# Patient Record
Sex: Male | Born: 1965 | Race: White | Hispanic: No | Marital: Married | Smoking: Never smoker
Health system: Southern US, Community
[De-identification: ages and names within clinical notes are randomized; demographics above are authoritative.]

## PROBLEM LIST (undated history)

## (undated) DIAGNOSIS — H209 Unspecified iridocyclitis: Secondary | ICD-10-CM

## (undated) DIAGNOSIS — R112 Nausea with vomiting, unspecified: Secondary | ICD-10-CM

## (undated) DIAGNOSIS — T8859XA Other complications of anesthesia, initial encounter: Secondary | ICD-10-CM

## (undated) DIAGNOSIS — T4145XA Adverse effect of unspecified anesthetic, initial encounter: Secondary | ICD-10-CM

## (undated) DIAGNOSIS — T7840XA Allergy, unspecified, initial encounter: Secondary | ICD-10-CM

## (undated) DIAGNOSIS — Z9889 Other specified postprocedural states: Secondary | ICD-10-CM

## (undated) HISTORY — DX: Allergy, unspecified, initial encounter: T78.40XA

## (undated) HISTORY — DX: Unspecified iridocyclitis: H20.9

## (undated) HISTORY — PX: MOUTH SURGERY: SHX715

## (undated) HISTORY — PX: ARTHROSCOPIC REPAIR ACL: SUR80

## (undated) HISTORY — PX: TONSILLECTOMY: SHX5217

## (undated) HISTORY — PX: KNEE ARTHROSCOPY W/ MENISCAL REPAIR: SHX1877

---

## 2002-10-25 ENCOUNTER — Ambulatory Visit (HOSPITAL_BASED_OUTPATIENT_CLINIC_OR_DEPARTMENT_OTHER): Admission: RE | Admit: 2002-10-25 | Discharge: 2002-10-25 | Payer: Self-pay | Admitting: Orthopaedic Surgery

## 2008-03-21 ENCOUNTER — Ambulatory Visit (HOSPITAL_BASED_OUTPATIENT_CLINIC_OR_DEPARTMENT_OTHER): Admission: RE | Admit: 2008-03-21 | Discharge: 2008-03-21 | Payer: Self-pay | Admitting: Orthopaedic Surgery

## 2008-06-12 ENCOUNTER — Encounter: Admission: RE | Admit: 2008-06-12 | Discharge: 2008-06-12 | Payer: Self-pay | Admitting: Family Medicine

## 2010-02-26 ENCOUNTER — Ambulatory Visit (HOSPITAL_BASED_OUTPATIENT_CLINIC_OR_DEPARTMENT_OTHER): Admission: RE | Admit: 2010-02-26 | Discharge: 2010-02-26 | Payer: Self-pay | Admitting: Orthopaedic Surgery

## 2010-11-05 NOTE — Op Note (Signed)
NAME:  Shawn Gillespie, Shawn Gillespie               ACCOUNT NO.:  [id number]   MEDICAL RECORD NO.:  [id number]          PATIENT TYPE:  AMB   LOCATION:  DSC                          FACILITY:  [facility]   PHYSICIAN:  Lubertha Basque. Dalldorf, M.D.DATE OF BIRTH:  Sep 22, 1965   DATE OF PROCEDURE:  03/21/2008  DATE OF DISCHARGE:                               OPERATIVE REPORT   PREOPERATIVE DIAGNOSIS:  1. Left knee torn medial meniscus.  2. Left knee chondromalacia.   POSTOPERATIVE DIAGNOSIS:  1. Left knee torn medial meniscus.  2. Left knee chondromalacia.   PROCEDURES:  1. Left knee partial medial meniscectomy.  2. Left knee abrasion chondroplasty patellofemoral.   ANESTHESIA:  General.   ATTENDING SURGEON:  Lubertha Basque. Jerl Santos, MD.   ASSISTANT:  Lindwood Qua, PA   INDICATIONS FOR PROCEDURE:  The patient is a 45 year old man who is  several years from an ACL reconstruction.  Unfortunately, suffered a  painful episode with his knee few months ago, which has not gotten  better.  It is persisted with pain and a locking sensation.  By MRI  scan, he has a displaced bucket-handle tear of his meniscus.  He is  offered an arthroscopy.  Informed operative consent was obtained  discussion of possible complications of reaction to anesthesia and  infection.   SUMMARY/FINDINGS/PROCEDURE:  Under general anesthesia, an arthroscopy of  the left knee was performed.  Suprapatellar pouch was benign while the  patellofemoral joint exhibited some grade-4 change mostly to the  intertrochlear groove.  A thorough chondroplasty was done along with  bleeding bone abrasion in some small areas.  Medial compartment  exhibited a displaced bucket-handle tear addressed with 25% partial  medial meniscectomy.  He also had some grade-4 change on the end of the  femur in a quarter-sized area and a thorough chondroplasty was done here  of loose articular cartilage.  The ACL reconstruction graft appeared to  be intact.  The lateral  compartment exhibited no evidence of meniscal or  articular cartilage injury.   DESCRIPTION OF PROCEDURE:  The patient was taken to the operating suite  where general anesthetic was applied without difficulty.  He is  positioned supine and prepped and draped in normal sterile fashion.  After the administration of IV Kefzol, an arthroscopy of the left knee  was performed through total of two portals.  Findings were as noted  above.  Procedure consisted of the partial medial meniscectomy done with  shaver and basket followed by the abrasion chondroplasty patellofemoral  and chondroplasty medial.  The knee was thoroughly irrigated followed by  placement of Marcaine with epinephrine and morphine.  Adaptic was placed  over the portals followed by dry gauze and a loose Ace wrap.  Estimated  blood loss and fluids obtained from anesthesia records.   DISPOSITION:  The patient was explained in the operating room and taken  to the recovery room in stable condition.  He was to go home same day  and follow up in the office closely.  I will contact him by phone  tonight.      Lubertha Basque Jerl Santos, M.D.  Electronically Signed     PGD/MEDQ  D:  03/21/2008  T:  03/22/2008  Job:  [id number]

## 2010-11-08 IMAGING — US US CAROTID DUPLEX BILAT
1 series · 13 of 24 positions shown · non-contrast
Comparison: None

CLINICAL DATA: Left neck bruit.

BILATERAL CAROTID DUPLEX ULTRASOUND
TECHNIQUE: Gray scale imaging, color Doppler and duplex ultrasound
was performed of bilateral carotid and vertebral arteries in the
neck.

[Series 1: us carotid duplex bilat · 0.09mm/px · 13 of 55 slices shown]
[im 1/55]
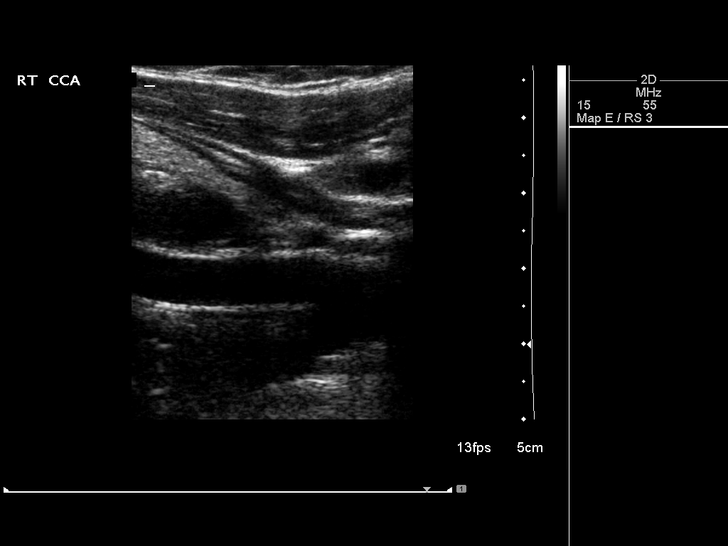
[im 5/55]
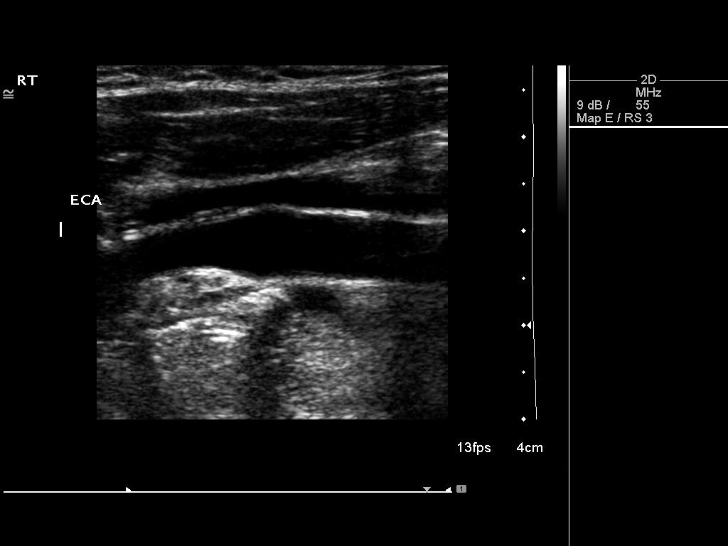
[im 10/55]
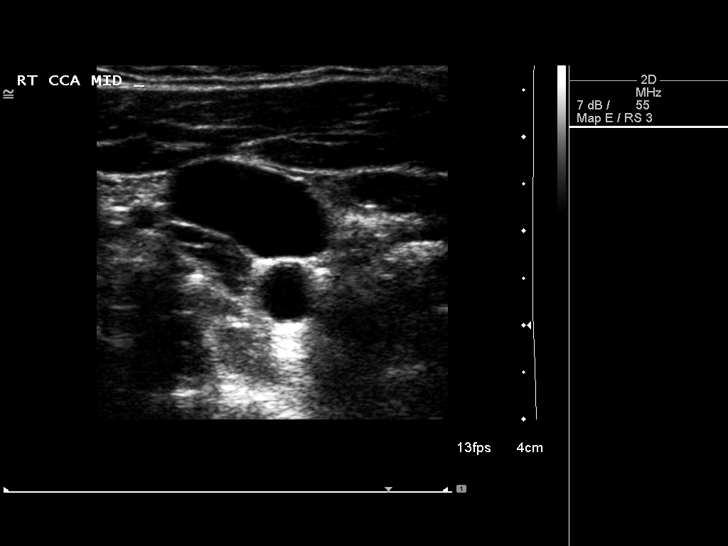
[im 15/55]
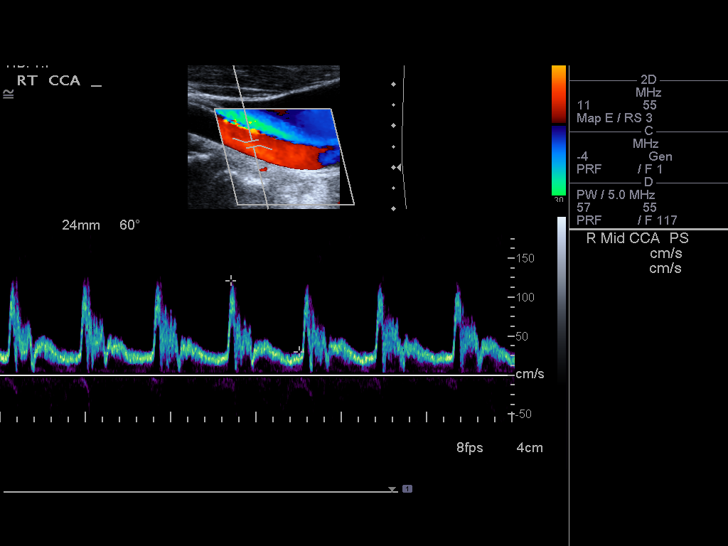
[im 19/55]
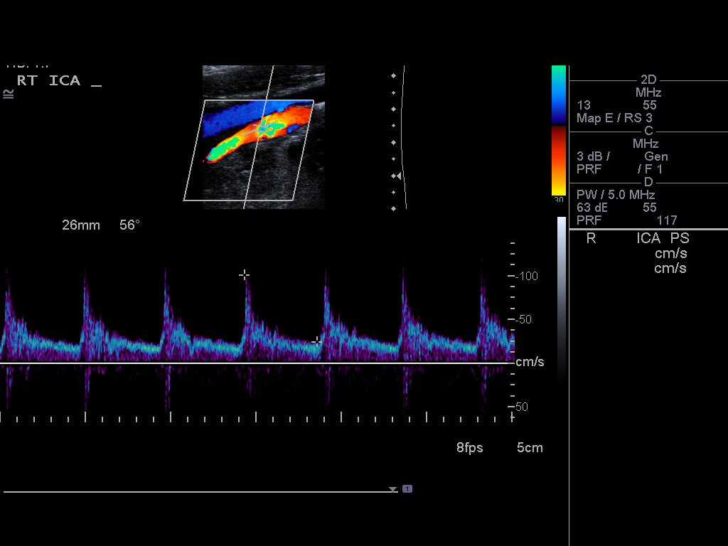
[im 24/55]
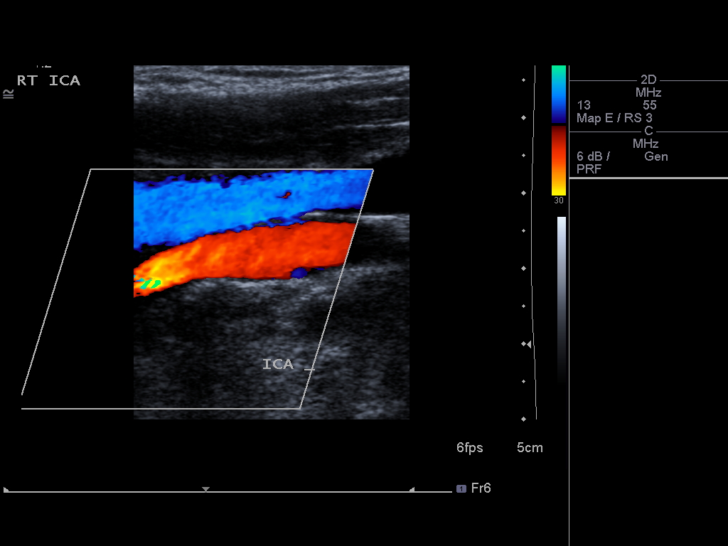
[im 29/55]
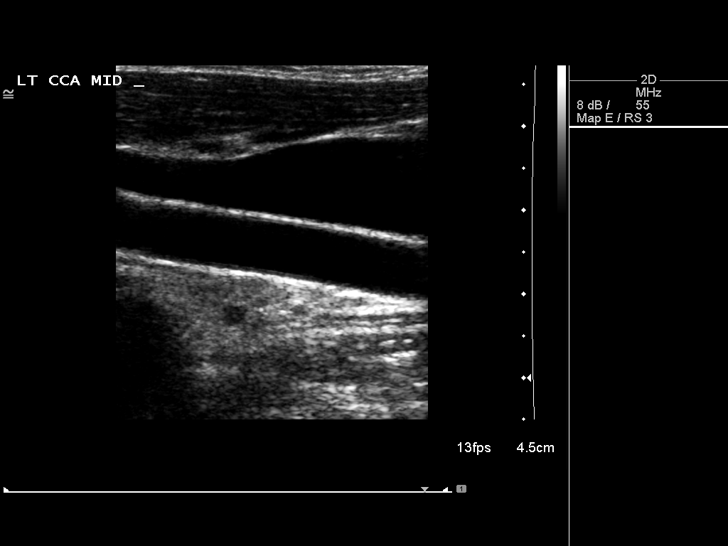
[im 31/55]
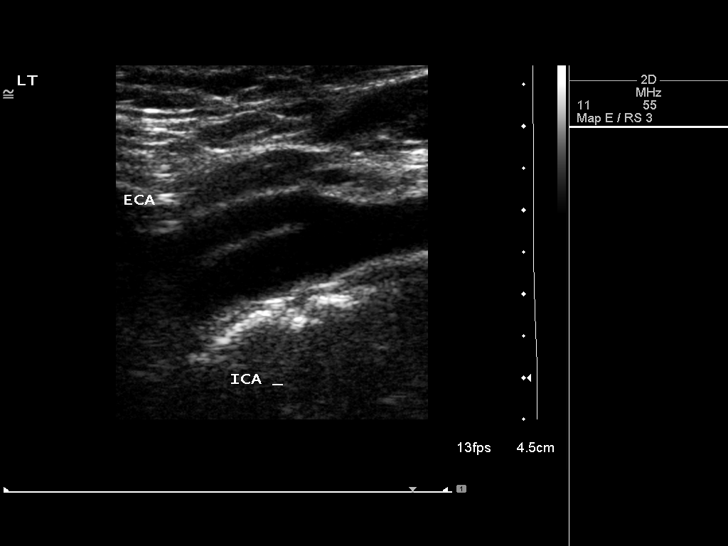
[im 36/55]
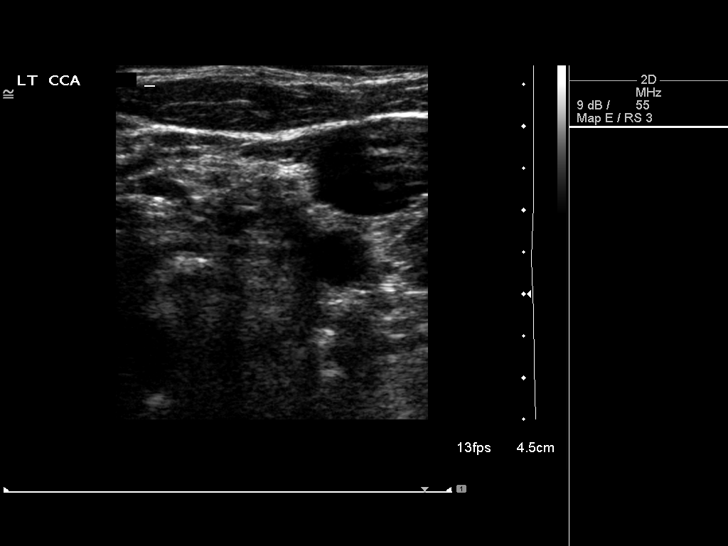
[im 40/55]
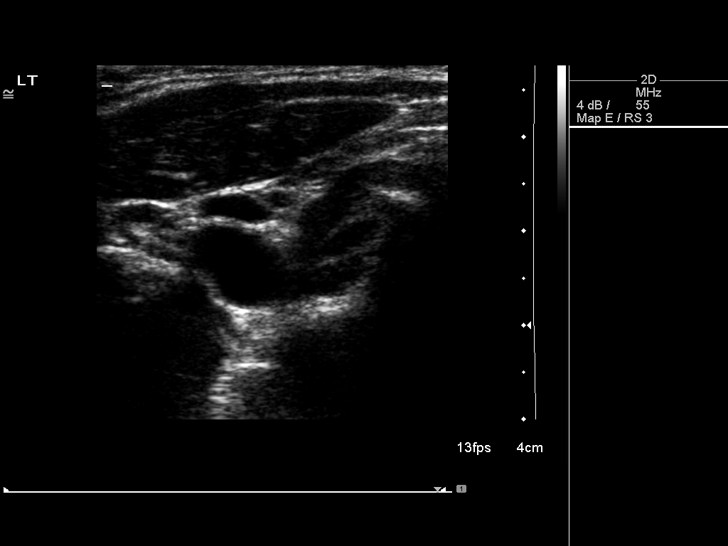
[im 45/55]
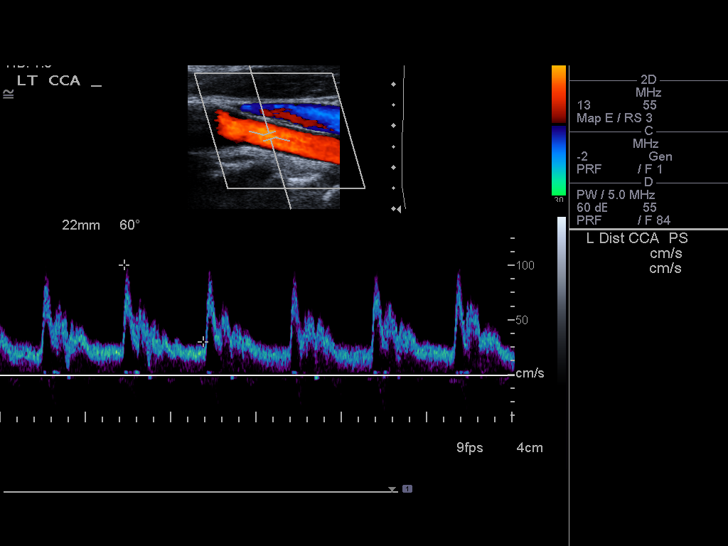
[im 50/55]
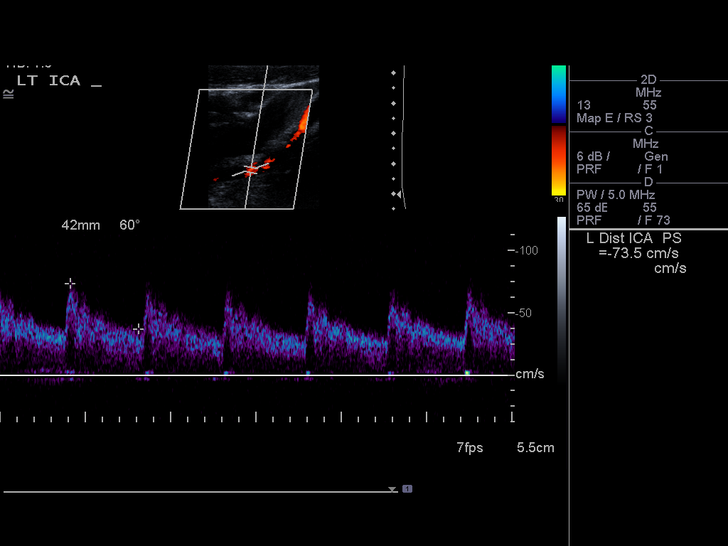
[im 55/55]
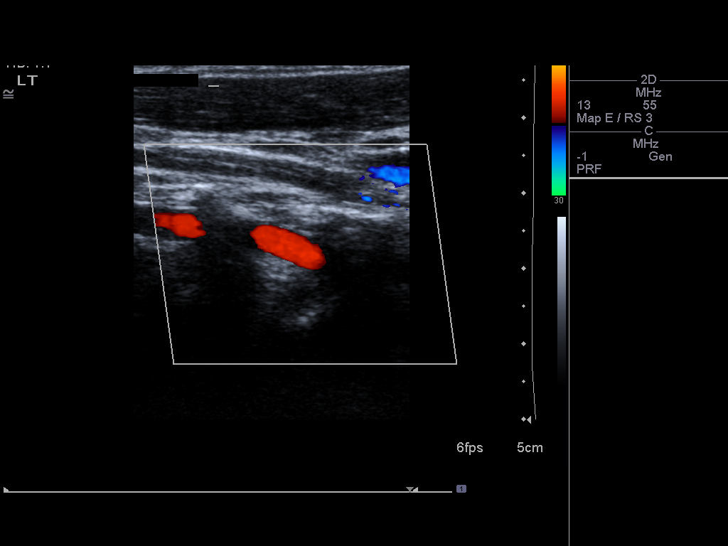

[13 of 24 positions shown; findings below may reference images not displayed]

Criteria:  Quantification of carotid stenosis is based on velocity
parameters that correlate the residual internal carotid diameter
with NASCET-based stenosis levels.

The following velocity measurements were obtained:

                 PEAK SYSTOLIC/END DIASTOLIC
RIGHT
ICA:                        102cm/sec
CCA:                        157cm/sec
SYSTOLIC ICA/CCA RATIO:
DIASTOLIC ICA/CCA RATIO:
ECA:                        102cm/sec

LEFT
ICA:                        107cm/sec
CCA:                        129cm/sec
SYSTOLIC ICA/CCA RATIO:
DIASTOLIC ICA/CCA RATIO:
ECA:                        111cm/sec
FINDINGS: RIGHT CAROTID ARTERY: The right carotid arteries are patent with
normal waveforms.

RIGHT VERTEBRAL ARTERY:  Antegrade flow in the right vertebral
artery with a low resistance waveform.

LEFT CAROTID ARTERY: Left carotid arteries are patent with normal
waveforms.

LEFT VERTEBRAL ARTERY:  Normal waveform and antegrade flow in the
left vertebral artery.
IMPRESSION: No significant carotid artery stenosis.  Narrowing less than 50%
based on velocities.

## 2010-11-08 NOTE — Op Note (Signed)
NAME:  Shawn Gillespie, Shawn Gillespie                         ACCOUNT NO.:  [id number]   MEDICAL RECORD NO.:  [id number]                   PATIENT TYPE:  AMB   LOCATION:  DSC                                  FACILITY:  [facility]   PHYSICIAN:  Lubertha Basque. Jerl Santos, M.D.             DATE OF BIRTH:  1966-02-13   DATE OF PROCEDURE:  10/25/2002  DATE OF DISCHARGE:                                 OPERATIVE REPORT   PREOPERATIVE DIAGNOSES:  1. Left knee anterior cruciate ligament tear.  2. Left knee torn meniscus.   POSTOPERATIVE DIAGNOSES:  1. Left knee anterior cruciate ligament tear.  2. Left knee torn meniscus.   PROCEDURES:  1. Left knee anterior cruciate ligament reconstruction.  2. Left knee partial lateral meniscectomy.   ANESTHESIA:  General.   SURGEON:  Lubertha Basque. Jerl Santos, M.D.   ASSISTANT:  Lindwood Qua, P.A.   INDICATION FOR PROCEDURE:  The patient is a 45 year old man who has a  history of a knee which gives away frequently.  He also has a knee which  locks.  This has become problematic to him in terms of being able to rest  and be active both.  He is offered an arthroscopy with ACL reconstruction.  Informed operative consent was obtained after a discussion of the possible  complications of, reaction to anesthesia, and infection.  He also understood  about the many months of rehabilitation protocol required after this type of  operation.   DESCRIPTION OF PROCEDURE:  The patient was  taken to the operating suite,  where a general anesthetic was applied without difficulty.  He was  positioned supine and prepped and draped in the normal sterile fashion.  After the administration of preoperative IV antibiotics, an arthroscopy of  the left knee was performed through two inferior portals.  The suprapatellar  pouch was benign, as was the patellofemoral joint.  The medial compartment  was benign with no evidence of meniscal articular cartilage injury.  The  lateral compartment did exhibit a  complex tear of the posterior and middle  horn of the lateral meniscus with a displaceable bucket handle fragment.  This was excised, taking about 20% of the lateral meniscus in the process  back to a fairly stable rim.  Some degenerative tissue was left, but it was  felt to preserve as much meniscus as possible, and it certainly looked  healthy enough to get by once his knee was stabilized.  There were no  degenerative changes in the compartment that I could appreciate.  The ACL  was completely torn, and the PCL appeared to be intact though a bit  atrophic.  A notchplasty was done, followed by removal of residual ACL  tissues.  An allograft was defrosted fully on the back table and prepared by  Lindwood Qua, P.A., to fit through 9 and 10 mm bony tunnels.  Drill  holes were placed in both of the bone  plugs with a wire placed in one and a  PDS suture placed in the other.  A tibial guide was utilized to pass a  guidewire from a separate incision on the anteromedial aspect of the tibia  up into the knee just anterior to the PCL.  This was over-reamed to a  diameter of 10 mm.  A guide was then placed through this tunnel in the over-  the-top position and utilized to pass a guidewire also through the tibial  tunnel and into the bone of the femur just anterior to the over-the-top  point.  This then exited the thigh proximally.  I utilized this guidewire to  drill the distal femur to a depth of about 3 cm and a diameter of 9 mm,  maintaining a visible 1-2 mm posterior wall.  Bony debris was removed from  the knee with the full radius resector.  The aforementioned allograft was  then pulled into place through the tibial tunnel into the femoral tunnel.  Care was taken to keep the tendinous surface of the graft faced in a  posterior direction as it entered the femoral tunnel.  The leading bone plug  was then secured in the femoral tunnel with an interference screw.  I placed  this over an  anterior guidewire, and the screw was an 8 x 25 metal [employer]  screw.  Tension was placed on the trailing bone plug, and the leading bone  plug could not be dislodged after this fixation.  The knee ranged fully and  the graft was felt to be quite isometric.  A second guidewire was then  placed through the tibial tunnel and seen to enter the knee.  A second  identical interference screw was placed over this guidewire to secure the  trailing bone plug in the tibial tunnel.  Again the knee ranged fully with  full extension.  The graft was taut throughout. The knee was thoroughly  irrigated, followed by removal of arthroscopic equipment.  The small  anterior tibial wound was reapproximated with a simple suture of nylon.  Adaptic was placed over the wounds, followed by dry gauze and a loose Ace  wrap.  Estimated blood loss and intraoperative fluids can be obtained from  anesthesia records.  No  tourniquet was placed or utilized.   DISPOSITION:  The patient was extubated in the operating room and taken to  the recovery room in stable condition.  Plans were for him to stay overnight  for pain control with possible discharge home today depending on his  condition later this afternoon.                                               Lubertha Basque Jerl Santos, M.D.    PGD/MEDQ  D:  10/25/2002  T:  10/25/2002  Job:  [id number]

## 2013-11-29 ENCOUNTER — Encounter (INDEPENDENT_AMBULATORY_CARE_PROVIDER_SITE_OTHER): Payer: Self-pay | Admitting: Surgery

## 2013-11-30 ENCOUNTER — Encounter (INDEPENDENT_AMBULATORY_CARE_PROVIDER_SITE_OTHER): Payer: Self-pay | Admitting: Surgery

## 2013-12-02 ENCOUNTER — Ambulatory Visit (INDEPENDENT_AMBULATORY_CARE_PROVIDER_SITE_OTHER): Payer: BC Managed Care – PPO | Admitting: Surgery

## 2013-12-05 ENCOUNTER — Ambulatory Visit (INDEPENDENT_AMBULATORY_CARE_PROVIDER_SITE_OTHER): Payer: BC Managed Care – PPO | Admitting: Surgery

## 2013-12-05 ENCOUNTER — Encounter (INDEPENDENT_AMBULATORY_CARE_PROVIDER_SITE_OTHER): Payer: Self-pay | Admitting: Surgery

## 2013-12-05 VITALS — BP 128/80 | HR 48 | Temp 97.7°F | Ht 65.0 in | Wt 168.0 lb

## 2013-12-05 DIAGNOSIS — D126 Benign neoplasm of colon, unspecified: Secondary | ICD-10-CM

## 2013-12-05 DIAGNOSIS — K635 Polyp of colon: Secondary | ICD-10-CM | POA: Insufficient documentation

## 2013-12-05 NOTE — Progress Notes (Signed)
Patient ID: Shawn Gillespie, male   DOB: Mar 24, 1966, 48 y.o.   MRN: [id number]  Chief Complaint  Patient presents with  . eval for appy/polyp    HPI Shawn Gillespie is a 48 y.o. male.  Referred by Dr. Arta Silence for appendiceal tubular adenoma PCP - Briscoe Deutscher Physician at [facility] - Dr. Robby Sermon HPI This is a 48 year old male in reasonably good health who recently underwent his first screening colonoscopy. His colon was overall clear except for a small polyp at the appendiceal orifice. This was biopsied and showed tubular adenoma. There is no sign of high-grade dysplasia or malignancy. Dr. Paulita Fujita recommended resection and the patient is now referred for surgical evaluation.  He denies any abdominal pain or any problems with his bowel movements or appetite. He has never had any abdominal surgery. 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 Past Medical History  Diagnosis Date  . Allergy   . Iritis     Past Surgical History  Procedure Laterality Date  . Arthroscopic repair acl    . Knee arthroscopy w/ meniscal repair    . Tonsillectomy      History reviewed. No pertinent family history.  Social History History  Substance Use Topics  . Smoking status: Never Smoker   . Smokeless tobacco: Not on file  . Alcohol Use: 0.6 - 1.2 oz/week    1-2 Cans of beer per week    Allergies  Allergen Reactions  . Sodium Acetylsalicylate [Aspirin]   . Sulfa Antibiotics     Current Outpatient Prescriptions  Medication Sig Dispense Refill  . aspirin 81 MG tablet Take 81 mg by mouth daily.      . beta carotene w/minerals (OCUVITE) tablet Take 1 tablet by mouth daily.      . mometasone (NASONEX) 50 MCG/ACT nasal spray Place 2 sprays into the nose daily.       No current facility-administered medications for this visit.    Review of Systems Review of Systems  Constitutional: Negative for fever, chills and unexpected weight change.   HENT: Negative for congestion, hearing loss, sore throat, trouble swallowing and voice change.   Eyes: Negative for visual disturbance.  Respiratory: Negative for cough and wheezing.   Cardiovascular: Negative for chest pain, palpitations and leg swelling.  Gastrointestinal: Negative for nausea, vomiting, abdominal pain, diarrhea, constipation, blood in stool, abdominal distention, anal bleeding and rectal pain.  Genitourinary: Negative for hematuria and difficulty urinating.  Musculoskeletal: Negative for arthralgias.  Skin: Negative for rash and wound.  Neurological: Negative for seizures, syncope, weakness and headaches.  Hematological: Negative for adenopathy. Does not bruise/bleed easily.  Psychiatric/Behavioral: Negative for confusion.    Blood pressure 128/80, pulse 48, temperature 97.7 F (36.5 C), height 5\' 5"  (1.651 m), weight 168 lb (76.204 kg).  Physical Exam Physical Exam WDWN in NAD HEENT:  EOMI, sclera anicteric Neck:  No masses, no thyromegaly Lungs:  CTA bilaterally; normal respiratory effort CV:  Regular rate and rhythm; no murmurs Abd:  +bowel sounds, soft, non-tender, no masses Ext:  Well-perfused; no edema Skin:  Warm, dry; no sign of jaundice  Data Reviewed Pathology report Colonoscopy report   Assessment    Tubular adenoma - partially visualized and biopsied - appendiceal orifice     Plan    Laparoscopic appendectomy with resection of a portion of the cecum.  The surgical procedure has been discussed with the patient.  Potential risks, benefits, alternative treatments,  and expected outcomes have been explained.  All of the patient's questions at this time have been answered.  The likelihood of reaching the patient's treatment goal is good.  The patient understand the proposed surgical procedure and wishes to proceed.         Christl Fessenden K. 12/05/2013, 9:59 AM

## 2013-12-07 ENCOUNTER — Encounter (HOSPITAL_COMMUNITY): Payer: Self-pay

## 2013-12-08 ENCOUNTER — Telehealth (INDEPENDENT_AMBULATORY_CARE_PROVIDER_SITE_OTHER): Payer: Self-pay

## 2013-12-08 ENCOUNTER — Encounter (HOSPITAL_COMMUNITY)
Admission: RE | Admit: 2013-12-08 | Discharge: 2013-12-08 | Disposition: A | Discharge status: Home / Self Care | Payer: BC Managed Care – PPO | Source: Ambulatory Visit | Attending: Surgery | Admitting: Surgery

## 2013-12-08 ENCOUNTER — Encounter (HOSPITAL_COMMUNITY): Payer: Self-pay

## 2013-12-08 DIAGNOSIS — Z01818 Encounter for other preprocedural examination: Secondary | ICD-10-CM | POA: Insufficient documentation

## 2013-12-08 DIAGNOSIS — Z01812 Encounter for preprocedural laboratory examination: Secondary | ICD-10-CM | POA: Insufficient documentation

## 2013-12-08 HISTORY — DX: Other complications of anesthesia, initial encounter: T88.59XA

## 2013-12-08 HISTORY — DX: Adverse effect of unspecified anesthetic, initial encounter: T41.45XA

## 2013-12-08 LAB — CBC
HCT: 41.3 % (ref 39.0–52.0)
Hemoglobin: 13.8 g/dL (ref 13.0–17.0)
MCH: 30.2 pg (ref 26.0–34.0)
MCHC: 33.4 g/dL (ref 30.0–36.0)
MCV: 90.4 fL (ref 78.0–100.0)
PLATELETS: 265 10*3/uL (ref 150–400)
RBC: 4.57 MIL/uL (ref 4.22–5.81)
RDW: 14.1 % (ref 11.5–15.5)
WBC: 6.8 10*3/uL (ref 4.0–10.5)

## 2013-12-08 LAB — BASIC METABOLIC PANEL
BUN: 16 mg/dL (ref 6–23)
CO2: 27 mEq/L (ref 19–32)
Calcium: 9.4 mg/dL (ref 8.4–10.5)
Chloride: 101 mEq/L (ref 96–112)
Creatinine, Ser: 0.8 mg/dL (ref 0.50–1.35)
Glucose, Bld: 86 mg/dL (ref 70–99)
POTASSIUM: 4.3 meq/L (ref 3.7–5.3)
SODIUM: 140 meq/L (ref 137–147)

## 2013-12-08 NOTE — Telephone Encounter (Signed)
Patient called after leaving his pre admissions testing stating they forgot to do his nose swab before he left and wants to make sure his surgery does not get cancelled. He states the ladies at pre op messed up his testing and is upset with [place] at the moment. His chart and insurance information got mixed up with his sons chart making him have to go to pre op 2 days straight. He says he was told he had to have a nose swab but they never did it. He is calling to make sure Dr Georgette Dover will not cancel his surgery for next week. He does not want to go back to pre op 3 days in a row now. Advised I am not sure that Dr Georgette Dover ordered the swab or if that came from [facility]. I let him know I would call him back tomorrow after running this by Dr Georgette Dover.

## 2013-12-08 NOTE — Pre-Procedure Instructions (Signed)
[name]  12/08/2013   Your procedure is scheduled on:  12/13/13  Report to [facility] Admitting at 7 AM.  Call this number if you have problems the morning of surgery: [phone]   Remember:   Do not eat food or drink liquids after midnight.   Take these medicines the morning of surgery with A SIP OF WATER: none   Do not wear jewelry, make-up or nail polish.  Do not wear lotions, powders, or perfumes. You may wear deodorant.  Do not shave 48 hours prior to surgery. Men may shave face and neck.  Do not bring valuables to the hospital.  [facility] is not responsible                  for any belongings or valuables.               Contacts, dentures or bridgework may not be worn into surgery.  Leave suitcase in the car. After surgery it may be brought to your room.  For patients admitted to the hospital, discharge time is determined by your                treatment team.               Patients discharged the day of surgerfy will not be allowed to drive  home.  Name and phone number of your driver: family  Special Instructions: Shower using CHG 2 nights before surgery and the night before surgery.  If you shower the day of surgery use CHG.  Use special wash - you have one bottle of CHG for all showers.  You should use approximately 1/3 of the bottle for each shower.   Please read over the following fact sheets that you were given: Pain Booklet, Coughing and Deep Breathing and Surgical Site Infection Prevention

## 2013-12-09 NOTE — Telephone Encounter (Signed)
I called pt back and let him know Dr Georgette Dover did not order the nose swab and that it is part of [facility]'s pre-admissions testing. I let him know I called Levada Dy over at [facility] and she states if he did not get the swab yesterday they would swab him the day of surgery, which is what they often do anyway. There should be no hold up with his surgery at this point.

## 2013-12-09 NOTE — Telephone Encounter (Signed)
This is not something that I ordered.  Pre-admission testing at [facility] would handle this.  I would speak with them and if they want to just start him on Mupirocin ointment, that would be fine with me.

## 2013-12-12 MED ORDER — CHLORHEXIDINE GLUCONATE 4 % EX LIQD
1.0000 "application " | Freq: Once | CUTANEOUS | Status: DC
  Filled 2013-12-12: qty 15

## 2013-12-12 MED ORDER — DEXTROSE 5 % IV SOLN
2.0000 g | INTRAVENOUS | Status: AC
  Administered 2013-12-13: 2 g via INTRAVENOUS
  Filled 2013-12-12: qty 2

## 2013-12-13 ENCOUNTER — Encounter (HOSPITAL_COMMUNITY): Payer: Self-pay

## 2013-12-13 ENCOUNTER — Ambulatory Visit (HOSPITAL_COMMUNITY): Payer: BC Managed Care – PPO | Admitting: Certified Registered"

## 2013-12-13 ENCOUNTER — Encounter (HOSPITAL_COMMUNITY)
Admission: RE | Disposition: A | Discharge status: Home / Self Care | Payer: Self-pay | Source: Ambulatory Visit | Attending: Surgery

## 2013-12-13 ENCOUNTER — Ambulatory Visit (HOSPITAL_COMMUNITY)
Admission: RE | Admit: 2013-12-13 | Discharge: 2013-12-13 | Disposition: A | Discharge status: Home / Self Care | Payer: BC Managed Care – PPO | Source: Ambulatory Visit | Attending: Surgery | Admitting: Surgery

## 2013-12-13 ENCOUNTER — Encounter (HOSPITAL_COMMUNITY): Payer: BC Managed Care – PPO | Admitting: Certified Registered"

## 2013-12-13 DIAGNOSIS — K635 Polyp of colon: Secondary | ICD-10-CM

## 2013-12-13 DIAGNOSIS — D126 Benign neoplasm of colon, unspecified: Secondary | ICD-10-CM | POA: Insufficient documentation

## 2013-12-13 DIAGNOSIS — Z79899 Other long term (current) drug therapy: Secondary | ICD-10-CM | POA: Insufficient documentation

## 2013-12-13 DIAGNOSIS — Z7982 Long term (current) use of aspirin: Secondary | ICD-10-CM | POA: Insufficient documentation

## 2013-12-13 DIAGNOSIS — Z882 Allergy status to sulfonamides status: Secondary | ICD-10-CM | POA: Insufficient documentation

## 2013-12-13 DIAGNOSIS — K389 Disease of appendix, unspecified: Secondary | ICD-10-CM

## 2013-12-13 HISTORY — DX: Nausea with vomiting, unspecified: R11.2

## 2013-12-13 HISTORY — PX: LAPAROSCOPIC APPENDECTOMY: SHX408

## 2013-12-13 HISTORY — DX: Other specified postprocedural states: Z98.890

## 2013-12-13 SURGERY — APPENDECTOMY, LAPAROSCOPIC
Anesthesia: General | Site: Abdomen

## 2013-12-13 MED ORDER — DEXMEDETOMIDINE HCL IN NACL 200 MCG/50ML IV SOLN
INTRAVENOUS | Status: DC | PRN
  Administered 2013-12-13: 0.5 ug/kg/h via INTRAVENOUS

## 2013-12-13 MED ORDER — DEXMEDETOMIDINE HCL IN NACL 200 MCG/50ML IV SOLN
INTRAVENOUS | Status: AC
  Filled 2013-12-13: qty 50

## 2013-12-13 MED ORDER — BUPIVACAINE-EPINEPHRINE (PF) 0.25% -1:200000 IJ SOLN
INTRAMUSCULAR | Status: AC
  Filled 2013-12-13: qty 30

## 2013-12-13 MED ORDER — MEPERIDINE HCL 25 MG/ML IJ SOLN
6.2500 mg | INTRAMUSCULAR | Status: DC | PRN
Start: 2013-12-13 — End: 2013-12-13

## 2013-12-13 MED ORDER — SUCCINYLCHOLINE CHLORIDE 20 MG/ML IJ SOLN
INTRAMUSCULAR | Status: AC
  Filled 2013-12-13: qty 1

## 2013-12-13 MED ORDER — LIDOCAINE HCL (CARDIAC) 20 MG/ML IV SOLN
INTRAVENOUS | Status: DC | PRN
  Administered 2013-12-13: 40 mg via INTRAVENOUS

## 2013-12-13 MED ORDER — MORPHINE SULFATE 2 MG/ML IJ SOLN: 2.0000 mg | INTRAMUSCULAR | Status: DC | PRN

## 2013-12-13 MED ORDER — 0.9 % SODIUM CHLORIDE (POUR BTL) OPTIME
TOPICAL | Status: DC | PRN
  Administered 2013-12-13: 1000 mL

## 2013-12-13 MED ORDER — HYDROMORPHONE HCL PF 1 MG/ML IJ SOLN
INTRAMUSCULAR | Status: AC
  Filled 2013-12-13: qty 1

## 2013-12-13 MED ORDER — EPHEDRINE SULFATE 50 MG/ML IJ SOLN
INTRAMUSCULAR | Status: DC | PRN
  Administered 2013-12-13: 10 mg via INTRAVENOUS

## 2013-12-13 MED ORDER — FENTANYL CITRATE 0.05 MG/ML IJ SOLN
INTRAMUSCULAR | Status: AC
  Filled 2013-12-13: qty 5

## 2013-12-13 MED ORDER — MIDAZOLAM HCL 2 MG/2ML IJ SOLN
INTRAMUSCULAR | Status: AC
  Filled 2013-12-13: qty 2

## 2013-12-13 MED ORDER — SODIUM CHLORIDE 0.9 % IR SOLN
Status: DC | PRN
  Administered 2013-12-13: 1000 mL

## 2013-12-13 MED ORDER — SUCCINYLCHOLINE CHLORIDE 20 MG/ML IJ SOLN
INTRAMUSCULAR | Status: DC | PRN
  Administered 2013-12-13: 100 mg via INTRAVENOUS

## 2013-12-13 MED ORDER — PROPOFOL 10 MG/ML IV BOLUS
INTRAVENOUS | Status: AC
  Filled 2013-12-13: qty 20

## 2013-12-13 MED ORDER — LACTATED RINGERS IV SOLN
INTRAVENOUS | Status: DC
  Administered 2013-12-13 (×2): via INTRAVENOUS

## 2013-12-13 MED ORDER — OXYCODONE HCL 5 MG PO TABS: 5.0000 mg | ORAL_TABLET | Freq: Once | ORAL | Status: DC | PRN

## 2013-12-13 MED ORDER — OXYCODONE HCL 5 MG/5ML PO SOLN: 5.0000 mg | Freq: Once | ORAL | Status: DC | PRN

## 2013-12-13 MED ORDER — FENTANYL CITRATE 0.05 MG/ML IJ SOLN
INTRAMUSCULAR | Status: DC | PRN
  Administered 2013-12-13: 50 ug via INTRAVENOUS
  Administered 2013-12-13: 150 ug via INTRAVENOUS

## 2013-12-13 MED ORDER — ONDANSETRON HCL 4 MG/2ML IJ SOLN: 4.0000 mg | INTRAMUSCULAR | Status: DC | PRN

## 2013-12-13 MED ORDER — OXYCODONE-ACETAMINOPHEN 5-325 MG PO TABS: 1.0000 | ORAL_TABLET | ORAL | Status: DC | PRN

## 2013-12-13 MED ORDER — PROPOFOL 10 MG/ML IV BOLUS
INTRAVENOUS | Status: DC | PRN
  Administered 2013-12-13: 200 mg via INTRAVENOUS

## 2013-12-13 MED ORDER — MIDAZOLAM HCL 2 MG/2ML IJ SOLN
0.5000 mg | Freq: Once | INTRAMUSCULAR | Status: AC | PRN
  Administered 2013-12-13: 2 mg via INTRAVENOUS

## 2013-12-13 MED ORDER — PROMETHAZINE HCL 25 MG/ML IJ SOLN
6.2500 mg | INTRAMUSCULAR | Status: DC | PRN
Start: 2013-12-13 — End: 2013-12-13

## 2013-12-13 MED ORDER — SODIUM CHLORIDE 0.9 % IJ SOLN
INTRAMUSCULAR | Status: AC
  Filled 2013-12-13: qty 10

## 2013-12-13 MED ORDER — EPHEDRINE SULFATE 50 MG/ML IJ SOLN
INTRAMUSCULAR | Status: AC
  Filled 2013-12-13: qty 1

## 2013-12-13 MED ORDER — ONDANSETRON HCL 4 MG/2ML IJ SOLN
INTRAMUSCULAR | Status: DC | PRN
  Administered 2013-12-13: 4 mg via INTRAVENOUS

## 2013-12-13 MED ORDER — HYDROMORPHONE HCL PF 1 MG/ML IJ SOLN
0.2500 mg | INTRAMUSCULAR | Status: DC | PRN
Start: 2013-12-13 — End: 2013-12-13

## 2013-12-13 MED ORDER — GLYCOPYRROLATE 0.2 MG/ML IJ SOLN
INTRAMUSCULAR | Status: DC | PRN
  Administered 2013-12-13: 0.4 mg via INTRAVENOUS

## 2013-12-13 MED ORDER — BUPIVACAINE-EPINEPHRINE 0.25% -1:200000 IJ SOLN
INTRAMUSCULAR | Status: DC | PRN
  Administered 2013-12-13: 15 mL

## 2013-12-13 MED ORDER — ONDANSETRON HCL 4 MG/2ML IJ SOLN
INTRAMUSCULAR | Status: AC
  Filled 2013-12-13: qty 2

## 2013-12-13 MED ORDER — MIDAZOLAM HCL 2 MG/2ML IJ SOLN
INTRAMUSCULAR | Status: DC | PRN
  Administered 2013-12-13: 2 mg via INTRAVENOUS

## 2013-12-13 SURGICAL SUPPLY — 42 items
APPLIER CLIP ROT 10 11.4 M/L (STAPLE)
BENZOIN TINCTURE PRP APPL 2/3 (GAUZE/BANDAGES/DRESSINGS) ×3 IMPLANT
BLADE SURG ROTATE 9660 (MISCELLANEOUS) ×3 IMPLANT
CANISTER SUCTION 2500CC (MISCELLANEOUS) ×3 IMPLANT
CHLORAPREP W/TINT 26ML (MISCELLANEOUS) ×3 IMPLANT
CLIP APPLIE ROT 10 11.4 M/L (STAPLE) IMPLANT
CONT SPEC 4OZ CLIKSEAL STRL BL (MISCELLANEOUS) ×3 IMPLANT
COVER SURGICAL LIGHT HANDLE (MISCELLANEOUS) ×3 IMPLANT
CUTTER FLEX LINEAR 45M (STAPLE) ×3 IMPLANT
DRAPE UTILITY 15X26 W/TAPE STR (DRAPE) ×6 IMPLANT
DRSG TEGADERM 2-3/8X2-3/4 SM (GAUZE/BANDAGES/DRESSINGS) ×6 IMPLANT
DRSG TEGADERM 4X4.75 (GAUZE/BANDAGES/DRESSINGS) ×3 IMPLANT
ELECT REM PT RETURN 9FT ADLT (ELECTROSURGICAL) ×3
ELECTRODE REM PT RTRN 9FT ADLT (ELECTROSURGICAL) ×1 IMPLANT
ENDOLOOP SUT PDS II  0 18 (SUTURE)
ENDOLOOP SUT PDS II 0 18 (SUTURE) IMPLANT
FILTER SMOKE EVAC LAPAROSHD (FILTER) IMPLANT
GAUZE SPONGE 2X2 8PLY STRL LF (GAUZE/BANDAGES/DRESSINGS) ×1 IMPLANT
GLOVE BIO SURGEON STRL SZ7 (GLOVE) ×3 IMPLANT
GLOVE BIO SURGEON STRL SZ7.5 (GLOVE) ×3 IMPLANT
GLOVE BIOGEL PI IND STRL 7.5 (GLOVE) ×4 IMPLANT
GLOVE BIOGEL PI INDICATOR 7.5 (GLOVE) ×8
GLOVE ECLIPSE 7.5 STRL STRAW (GLOVE) ×3 IMPLANT
GLOVE SURG SS PI 7.0 STRL IVOR (GLOVE) ×3 IMPLANT
GOWN STRL REUS W/ TWL LRG LVL3 (GOWN DISPOSABLE) ×4 IMPLANT
GOWN STRL REUS W/TWL LRG LVL3 (GOWN DISPOSABLE) ×8
KIT BASIN OR (CUSTOM PROCEDURE TRAY) ×3 IMPLANT
KIT ROOM TURNOVER OR (KITS) ×3 IMPLANT
NS IRRIG 1000ML POUR BTL (IV SOLUTION) ×3 IMPLANT
PAD ARMBOARD 7.5X6 YLW CONV (MISCELLANEOUS) ×6 IMPLANT
POUCH SPECIMEN RETRIEVAL 10MM (ENDOMECHANICALS) ×3 IMPLANT
RELOAD STAPLE TA45 3.5 REG BLU (ENDOMECHANICALS) ×6 IMPLANT
SCALPEL HARMONIC ACE (MISCELLANEOUS) ×3 IMPLANT
SET IRRIG TUBING LAPAROSCOPIC (IRRIGATION / IRRIGATOR) ×3 IMPLANT
SPECIMEN JAR SMALL (MISCELLANEOUS) IMPLANT
SPONGE GAUZE 2X2 STER 10/PKG (GAUZE/BANDAGES/DRESSINGS) ×2
SUT MNCRL AB 4-0 PS2 18 (SUTURE) ×3 IMPLANT
TOWEL OR 17X24 6PK STRL BLUE (TOWEL DISPOSABLE) IMPLANT
TOWEL OR 17X26 10 PK STRL BLUE (TOWEL DISPOSABLE) ×3 IMPLANT
TRAY LAPAROSCOPIC (CUSTOM PROCEDURE TRAY) ×3 IMPLANT
TROCAR XCEL BLADELESS 5X75MML (TROCAR) ×6 IMPLANT
TROCAR XCEL BLUNT TIP 100MML (ENDOMECHANICALS) ×3 IMPLANT

## 2013-12-13 NOTE — H&P (View-Only) (Signed)
Patient ID: Shawn Gillespie, male   DOB: 11-08-1965, 48 y.o.   MRN: [id number]  Chief Complaint  Patient presents with  . eval for appy/polyp    HPI GERHARD RAPPAPORT is a 48 y.o. male.  Referred by Dr. Arta Silence for appendiceal tubular adenoma PCP - Briscoe Deutscher Physician at [facility] - Dr. Robby Sermon HPI This is a 48 year old male in reasonably good health who recently underwent his first screening colonoscopy. His colon was overall clear except for a small polyp at the appendiceal orifice. This was biopsied and showed tubular adenoma. There is no sign of high-grade dysplasia or malignancy. Dr. Paulita Fujita recommended resection and the patient is now referred for surgical evaluation.  He denies any abdominal pain or any problems with his bowel movements or appetite. He has never had any abdominal surgery. 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 Past Medical History  Diagnosis Date  . Allergy   . Iritis     Past Surgical History  Procedure Laterality Date  . Arthroscopic repair acl    . Knee arthroscopy w/ meniscal repair    . Tonsillectomy      History reviewed. No pertinent family history.  Social History History  Substance Use Topics  . Smoking status: Never Smoker   . Smokeless tobacco: Not on file  . Alcohol Use: 0.6 - 1.2 oz/week    1-2 Cans of beer per week    Allergies  Allergen Reactions  . Sodium Acetylsalicylate [Aspirin]   . Sulfa Antibiotics     Current Outpatient Prescriptions  Medication Sig Dispense Refill  . aspirin 81 MG tablet Take 81 mg by mouth daily.      . beta carotene w/minerals (OCUVITE) tablet Take 1 tablet by mouth daily.      . mometasone (NASONEX) 50 MCG/ACT nasal spray Place 2 sprays into the nose daily.       No current facility-administered medications for this visit.    Review of Systems Review of Systems  Constitutional: Negative for fever, chills and unexpected weight change.   HENT: Negative for congestion, hearing loss, sore throat, trouble swallowing and voice change.   Eyes: Negative for visual disturbance.  Respiratory: Negative for cough and wheezing.   Cardiovascular: Negative for chest pain, palpitations and leg swelling.  Gastrointestinal: Negative for nausea, vomiting, abdominal pain, diarrhea, constipation, blood in stool, abdominal distention, anal bleeding and rectal pain.  Genitourinary: Negative for hematuria and difficulty urinating.  Musculoskeletal: Negative for arthralgias.  Skin: Negative for rash and wound.  Neurological: Negative for seizures, syncope, weakness and headaches.  Hematological: Negative for adenopathy. Does not bruise/bleed easily.  Psychiatric/Behavioral: Negative for confusion.    Blood pressure 128/80, pulse 48, temperature 97.7 F (36.5 C), height 5\' 5"  (1.651 m), weight 168 lb (76.204 kg).  Physical Exam Physical Exam WDWN in NAD HEENT:  EOMI, sclera anicteric Neck:  No masses, no thyromegaly Lungs:  CTA bilaterally; normal respiratory effort CV:  Regular rate and rhythm; no murmurs Abd:  +bowel sounds, soft, non-tender, no masses Ext:  Well-perfused; no edema Skin:  Warm, dry; no sign of jaundice  Data Reviewed Pathology report Colonoscopy report   Assessment    Tubular adenoma - partially visualized and biopsied - appendiceal orifice     Plan    Laparoscopic appendectomy with resection of a portion of the cecum.  The surgical procedure has been discussed with the patient.  Potential risks, benefits, alternative treatments,  and expected outcomes have been explained.  All of the patient's questions at this time have been answered.  The likelihood of reaching the patient's treatment goal is good.  The patient understand the proposed surgical procedure and wishes to proceed.         Gelena Klosinski K. 12/05/2013, 9:59 AM

## 2013-12-13 NOTE — Anesthesia Procedure Notes (Signed)
Procedure Name: Intubation Date/Time: 12/13/2013 8:50 AM Performed by: Manuela Schwartz B Pre-anesthesia Checklist: Patient identified, Emergency Drugs available, Suction available, Patient being monitored and Timeout performed Patient Re-evaluated:Patient Re-evaluated prior to inductionOxygen Delivery Method: Circle system utilized Preoxygenation: Pre-oxygenation with 100% oxygen Intubation Type: IV induction Laryngoscope Size: Mac and 3 Grade View: Grade I Tube type: Oral Tube size: 7.5 mm Number of attempts: 1 Airway Equipment and Method: Stylet Placement Confirmation: ETT inserted through vocal cords under direct vision,  positive ETCO2 and breath sounds checked- equal and bilateral Secured at: 22 cm Tube secured with: Tape Dental Injury: Teeth and Oropharynx as per pre-operative assessment

## 2013-12-13 NOTE — Op Note (Signed)
Appendectomy, Lap, Procedure Note  Indications: The patient presented after a recent screening colonoscopy with a polyp visualized at the appendiceal orifice.  Biopsy showed no sign of malignancy, but he is recommended to have appendectomy to rule out atypia or occult malignancy in the appendix.  Pre-operative Diagnosis: Appendiceal polyp  Post-operative Diagnosis: Same  Surgeon: TSUEI,MATTHEW K.   Assistants: Judyann Munson, RNFA  Anesthesia: General endotracheal anesthesia  ASA Class: 1  Procedure Details  The patient was seen again in the Holding Room. The risks, benefits, complications, treatment options, and expected outcomes were discussed with the patient and/or family. The possibilities of reaction to medication, perforation of viscus, bleeding, recurrent infection, finding a normal appendix, the need for additional procedures, failure to diagnose a condition, and creating a complication requiring transfusion or operation were discussed. There was concurrence with the proposed plan and informed consent was obtained. The site of surgery was properly noted. The patient was taken to Operating Room, identified as Shawn Gillespie and the procedure verified as Appendectomy. A Time Out was held and the above information confirmed.  The patient was placed in the supine position and general anesthesia was induced.  The abdomen was prepped and draped in a sterile fashion. A one centimeter supraumbilical incision was made.  Dissection was carried down to the fascia bluntly.  The fascia was incised vertically.  We entered the peritoneal cavity bluntly.  A pursestring suture was passed around the incision with a 0 Vicryl.  The Hasson cannula was introduced into the abdomen and the tails of the suture were used to hold the Hasson in place.   The pneumoperitoneum was then established maintaining a maximum pressure of 15 mmHg.  Additional 5 mm cannulas then placed in the left lower quadrant of the  abdomen and the right upper quadrant under direct visualization. A careful evaluation of the entire abdomen was carried out. The patient was placed in Trendelenburg and left lateral decubitus position.  The scope was moved to the right upper quadrant port site. The cecum was mobilized medially.  The appendix was identified and showed no sign of inflammation or external thickening. The appendix was carefully dissected. The appendix was was skeletonized with the harmonic scalpel.  The Endo-GIA stapler was used to resect the appendix along with a small corner of the cecum. There was no evidence of bleeding, leakage, or complication after division of the appendix. Irrigation was also performed and irrigate suctioned from the abdomen as well.  The umbilical port site was closed with the purse string suture. There was no residual palpable fascial defect.  The trocar site skin wounds were closed with 4-0 Monocryl.  Instrument, sponge, and needle counts were correct at the conclusion of the case.   Findings: The appendix was grossly normal in appearance.  Estimated Blood Loss:  Minimal         Drains: none         Specimens: appendix         Complications:  None; patient tolerated the procedure well.         Disposition: PACU - hemodynamically stable.         Condition: stable  Imogene Burn. Georgette Dover, MD, [facility]  General/ Trauma Surgery  12/13/2013 9:36 AM

## 2013-12-13 NOTE — Transfer of Care (Signed)
Immediate Anesthesia Transfer of Care Note  Patient: Shawn Gillespie  Procedure(s) Performed: Procedure(s): APPENDECTOMY LAPAROSCOPIC (N/A)  Patient Location: PACU  Anesthesia Type:General  Level of Consciousness: sedated  Airway & Oxygen Therapy: Patient remains intubated per anesthesia plan and Patient placed on Ventilator (see vital sign flow sheet for setting)  Post-op Assessment: Post -op Vital signs reviewed and stable  Post vital signs: Reviewed and stable  Complications: TOF 0/4 one hour after induction with ANECTINE, patient remains intubated/on ventilator in PACU.

## 2013-12-13 NOTE — Interval H&P Note (Signed)
History and Physical Interval Note:  12/13/2013 7:24 AM  Shawn Gillespie  has presented today for surgery, with the diagnosis of appendrceal polyp  The various methods of treatment have been discussed with the patient and family. After consideration of risks, benefits and other options for treatment, the patient has consented to  Procedure(s): APPENDECTOMY LAPAROSCOPIC (N/A) as a surgical intervention .  The patient's history has been reviewed, patient examined, no change in status, stable for surgery.  I have reviewed the patient's chart and labs.  Questions were answered to the patient's satisfaction.     TSUEI,MATTHEW K.

## 2013-12-13 NOTE — Anesthesia Postprocedure Evaluation (Signed)
  Anesthesia Post-op Note  Patient: Shawn Gillespie  Procedure(s) Performed: Procedure(s): APPENDECTOMY LAPAROSCOPIC (N/A)  Patient Location: PACU  Anesthesia Type:General  Level of Consciousness: awake, alert , oriented and patient cooperative  Airway and Oxygen Therapy: Patient Spontanous Breathing  Post-op Pain: none  Post-op Assessment: Post-op Vital signs reviewed, Patient's Cardiovascular Status Stable, Respiratory Function Stable, Patent Airway, No signs of Nausea or vomiting and Pain level controlled  Post-op Vital Signs: Reviewed and stable  Last Vitals:  Filed Vitals:   12/13/13 1345  BP: 76/46  Pulse: 47  Temp:   Resp:     Complications: adverse drug reaction: prolonged muscle relaxation with Succinylcholine, discussed fully with patient and his wife

## 2013-12-13 NOTE — Progress Notes (Signed)
Spoke with dr c Glennon Mac re sbp [phone]  And hr 50's, alert/skin warm/dry/ pink and oriented x 4 and asymptomatic. DTV after 1867mls ivf and cup of coffee. Unsuccessful void, woll stay in P2 with that nurse until ready for d/c.

## 2013-12-13 NOTE — Progress Notes (Signed)
Patient was brought to PACU intubated, ventilated, and sedated after prolonged muscle relaxation following Succinylcholine. Now with 4 twitches by PNS, sustained head lift, strong grip to command.  Suctioned oropharynx and extubated to [place] O2.  VSS  Will discuss with patient and his wife in detail.  Dr Georgette Dover aware.      Jenita Seashore, MD

## 2013-12-13 NOTE — Anesthesia Preprocedure Evaluation (Addendum)
Anesthesia Evaluation  Patient identified by MRN, date of birth, ID band Patient awake    Reviewed: Allergy & Precautions, H&P , NPO status , Patient's Chart, lab work & pertinent test results  History of Anesthesia Complications (+) PONV, PROLONGED EMERGENCE and history of anesthetic complications  Airway Mallampati: I TM Distance: >3 FB Neck ROM: Full    Dental  (+) Teeth Intact, Dental Advisory Given   Pulmonary neg pulmonary ROS,  breath sounds clear to auscultation  Pulmonary exam normal       Cardiovascular negative cardio ROS  Rhythm:Regular Rate:Normal     Neuro/Psych negative neurological ROS     GI/Hepatic negative GI ROS, Neg liver ROS,   Endo/Other  negative endocrine ROS  Renal/GU negative Renal ROS     Musculoskeletal   Abdominal   Peds  Hematology negative hematology ROS (+)   Anesthesia Other Findings   Reproductive/Obstetrics                          Anesthesia Physical Anesthesia Plan  ASA: I  Anesthesia Plan: General   Post-op Pain Management:    Induction: Intravenous  Airway Management Planned: Oral ETT  Additional Equipment:   Intra-op Plan:   Post-operative Plan: Extubation in OR  Informed Consent: I have reviewed the patients History and Physical, chart, labs and discussed the procedure including the risks, benefits and alternatives for the proposed anesthesia with the patient or authorized representative who has indicated his/her understanding and acceptance.   Dental advisory given  Plan Discussed with: Anesthesiologist, Surgeon and CRNA  Anesthesia Plan Comments: (Plan routine monitors, GETA)       Anesthesia Quick Evaluation

## 2013-12-13 NOTE — Discharge Instructions (Signed)
[facility] LAPAROSCOPIC SURGERY: POST OP INSTRUCTIONS Always review your discharge instruction sheet given to you by the facility where your surgery was performed. IF YOU HAVE DISABILITY OR FAMILY LEAVE FORMS, YOU MUST BRING THEM TO THE OFFICE FOR PROCESSING.   DO NOT GIVE THEM TO YOUR DOCTOR.  1. A prescription for pain medication will be given to you upon discharge.  Take your pain medication as prescribed, if needed.  If narcotic pain medicine is not needed, then you may take acetaminophen (Tylenol) or ibuprofen (Advil) as needed. 2. Take your usually prescribed medications unless otherwise directed. 3. If you need a refill on your pain medication, please contact your pharmacy.  They will contact our office to request authorization. Prescriptions will not be filled after 5pm or on week-ends. 4. You should follow a light diet the first few days after arrival home, such as soup and crackers, etc.  Be sure to include lots of fluids daily. 5. Most patients will experience some swelling and bruising in the area of the incisions.  Ice packs will help.  Swelling and bruising can take several days to resolve.  6. It is common to experience some constipation if taking pain medication after surgery.  Increasing fluid intake and taking a stool softener (such as Colace) will usually help or prevent this problem from occurring.  A mild laxative (Milk of Magnesia or Miralax) should be taken according to package instructions if there are no bowel movements after 48 hours. 7. Unless discharge instructions indicate otherwise, you may remove your bandages 48 hours after surgery, and you may shower at that time.  You will have steri-strips (small skin tapes) in place directly over the incision.  These strips should be left on the skin for 7-10 days.  8. ACTIVITIES:  You may resume regular (light) daily activities beginning the next day--such as daily self-care, walking, climbing stairs--gradually  increasing activities as tolerated.  You may have sexual intercourse when it is comfortable.  Refrain from any heavy lifting or straining until approved by your doctor. a. You may drive when you are no longer taking prescription pain medication, you can comfortably wear a seatbelt, and you can safely maneuver your car and apply brakes. b. RETURN TO WORK:   2-3 weeks 9. You should see your doctor in the office for a follow-up appointment approximately 2-3 weeks after your surgery.  Make sure that you call for this appointment within a day or two after you arrive home to insure a convenient appointment time. 10. OTHER INSTRUCTIONS: ________________________________________________________________________ WHEN TO CALL YOUR DOCTOR: 1. Fever over 101.0 2. Inability to urinate 3. Continued bleeding from incision. 4. Increased pain, redness, or drainage from the incision. 5. Increasing abdominal pain  The clinic staff is available to answer your questions during regular business hours.  Please dont hesitate to call and ask to speak to one of the nurses for clinical concerns.  If you have a medical emergency, go to the nearest emergency room or call 911.  A surgeon from [facility] is always on call at the hospital. [address], [place], [place], [place]  [address] ? [address], [place], [place]   [address] [phone] ? [phone] ? FAX [phone] Web site: [email or web address]   What to eat:  For your first meals, you should eat lightly; only small meals initially.  If you do not have nausea, you may eat larger meals.  Avoid spicy, greasy and heavy food.  General Anesthesia, Adult, Care After  Refer to this sheet in the next few weeks. These instructions provide you with information on caring for yourself after your procedure. Your health care provider may also give you more specific instructions. Your treatment has been planned according to current medical  practices, but problems sometimes occur. Call your health care provider if you have any problems or questions after your procedure.  WHAT TO EXPECT AFTER THE PROCEDURE  After the procedure, it is typical to experience:  Sleepiness.  Nausea and vomiting. HOME CARE INSTRUCTIONS  For the first 24 hours after general anesthesia:  Have a responsible person with you.  Do not drive a car. If you are alone, do not take public transportation.  Do not drink alcohol.  Do not take medicine that has not been prescribed by your health care provider.  Do not sign important papers or make important decisions.  You may resume a normal diet and activities as directed by your health care provider.  Change bandages (dressings) as directed.  If you have questions or problems that seem related to general anesthesia, call the hospital and ask for the anesthetist or anesthesiologist on call. SEEK MEDICAL CARE IF:  You have nausea and vomiting that continue the day after anesthesia.  You develop a rash. SEEK IMMEDIATE MEDICAL CARE IF:  You have difficulty breathing.  You have chest pain.  You have any allergic problems. Document Released: 09/15/2000 Document Revised: 02/09/2013 Document Reviewed: 12/23/2012  [facility] Patient Information 2014 [place], [place].

## 2013-12-14 ENCOUNTER — Telehealth (INDEPENDENT_AMBULATORY_CARE_PROVIDER_SITE_OTHER): Payer: Self-pay | Admitting: General Surgery

## 2013-12-14 NOTE — Telephone Encounter (Signed)
Message copied by Maryclare Bean on Wed Dec 14, 2013  2:22 PM ------      Message from: Donnie Mesa K      Created: Wed Dec 14, 2013 12:46 PM       Forwarded path report            Please call the patient and let him know that this was only a tubular adenoma.  No sign of cancer.       ------

## 2013-12-14 NOTE — Telephone Encounter (Signed)
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 Patient states that he feels good and was very pleased how everything went with his surgery

## 2013-12-15 ENCOUNTER — Encounter (HOSPITAL_COMMUNITY): Payer: Self-pay | Admitting: Surgery

## 2013-12-27 ENCOUNTER — Encounter (INDEPENDENT_AMBULATORY_CARE_PROVIDER_SITE_OTHER): Payer: Self-pay

## 2014-01-02 ENCOUNTER — Ambulatory Visit (INDEPENDENT_AMBULATORY_CARE_PROVIDER_SITE_OTHER): Payer: BC Managed Care – PPO | Admitting: Surgery

## 2014-01-02 ENCOUNTER — Encounter (INDEPENDENT_AMBULATORY_CARE_PROVIDER_SITE_OTHER): Payer: Self-pay | Admitting: Surgery

## 2014-01-02 VITALS — BP 122/76 | HR 75 | Temp 98.2°F | Ht 63.0 in | Wt 167.0 lb

## 2014-01-02 DIAGNOSIS — D126 Benign neoplasm of colon, unspecified: Secondary | ICD-10-CM

## 2014-01-02 DIAGNOSIS — K635 Polyp of colon: Secondary | ICD-10-CM

## 2014-01-02 NOTE — Patient Instructions (Signed)
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

## 2014-01-02 NOTE — Progress Notes (Signed)
Status post a laparoscopic appendectomy on 12/13/13 for an appendiceal polyp. The pathology showed a tubular adenoma. Patient is doing quite well. He has been limiting his level of activity. No further pain. His incisions are well-healed with no sign of infection.  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 The succinylcholine did not metabolize normally and he was on the ventilator for several hours. The patient will get a medical alert bracelet to prevent this from happening again.  He may resume flow to the. Followup when necessary.  Imogene Burn. Georgette Dover, MD, [facility]  General/ Trauma Surgery  01/02/2014 9:12 AM

## 2023-11-26 ENCOUNTER — Ambulatory Visit: Payer: Self-pay | Admitting: Podiatry

## 2023-11-26 ENCOUNTER — Ambulatory Visit (INDEPENDENT_AMBULATORY_CARE_PROVIDER_SITE_OTHER)

## 2023-11-26 DIAGNOSIS — M722 Plantar fascial fibromatosis: Secondary | ICD-10-CM

## 2023-11-26 DIAGNOSIS — M7752 Other enthesopathy of left foot: Secondary | ICD-10-CM

## 2023-11-26 MED ORDER — TRIAMCINOLONE ACETONIDE 10 MG/ML IJ SUSP
5.0000 mg | Freq: Once | INTRAMUSCULAR | Status: AC
  Administered 2023-11-26: 5 mg via INTRAMUSCULAR

## 2023-11-26 MED ORDER — MELOXICAM 15 MG PO TABS: 15.0000 mg | ORAL_TABLET | Freq: Every day | ORAL | 2 refills | Status: AC | PRN

## 2023-11-26 NOTE — Progress Notes (Signed)
 Subjective:  Patient ID: Shawn Gillespie, male    DOB: 1965/10/21,  MRN: [id number]  Chief Complaint  Patient presents with   Foot Pain    RM#13 Patient states has history of plantar fasciitis has been on foot more then usual presents with heel pain.    Discussed the use of AI scribe software for clinical note transcription with the patient, who gave verbal consent to proceed.  History of Present Illness Shawn Gillespie is a 58 year old male with plantar fasciitis who presents with left heel pain.  He experiences left heel pain, more intense than previous plantar fasciitis episodes, primarily located in the heel and sometimes radiating. 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 Work boots lacking arch support contribute to discomfort, while flat slippers provide relief.  He previously used a night splint and wobble board for plantar fasciitis but no longer uses the splint. He currently uses a frozen water bottle for relief and takes ibuprofen for severe pain, along with daily aspirin and vitamins.  There are no recent foot injuries.       Objective:    Physical Exam General: AAO x3, NAD  Dermatological: Skin is warm, dry and supple bilateral.  There are no open sores, no preulcerative lesions, no rash or signs of infection present.  Vascular: Dorsalis Pedis artery and Posterior Tibial artery pedal pulses are 2/4 bilateral with immedate capillary fill time. There is no pain with calf compression, swelling, warmth, erythema.   Neruologic: Grossly intact via light touch bilateral.  Negative Tinel sign.  Musculoskeletal: Tenderness to palpation along the plantar medial tubercle of the calcaneus at the insertion of plantar fascia on the left foot. There is no pain along the course of the plantar fascia within  the arch of the foot. Plantar fascia appears to be intact. There is no pain with lateral compression of the calcaneus or pain with vibratory sensation. There is no pain along the course or insertion of the achilles tendon. No other areas of tenderness to bilateral lower extremities.        Results RADIOLOGY Heel X-ray: Small calcaneal spur, no evidence of acute fracture.  (10/21/2023)   Assessment:   1. Plantar fasciitis      Plan:  Patient was evaluated and treated and all questions answered.  Assessment and Plan Assessment & Plan Plantar fasciitis with heel spur Chronic plantar fasciitis with heel spur in the left heel, exacerbated by prolonged standing and non-supportive footwear. X-rays show a small heel spur and thickened plantar fascia, indicating inflammation. - Prescribed meloxicam once daily for inflammation. - Administered steroid injection to reduce inflammation.  See procedure note below. - Advised daily icing of the heel using an ice pack or frozen water bottle. - Recommended daily stretching exercises for calf muscle, Achilles tendon, and plantar fascia. - Provided a plantar fascial brace to wear over socks inside shoes to help support stabilize the plantar fascia during the day. - Provided a night splint to maintain foot position during sleep. Static or dynamic ankle foot orthosis, including soft interface material, adjustable for fit, for positioning, may be used for minimal ambulation, prefabricated, off-the-shelf was dispensed on the billed date of service  - Advised wearing comfortable shoes with appropriate support. - Instructed to use anti-inflammatory medication or acetaminophen  for pain management as needed. - Advised icing the heel  post-steroid injection to prevent steroid flare. - Instructed to avoid strenuous activities today, allowing normal walking. - Discussed potential for steroid flare post-injection and advised using anti-inflammatory medication or  acetaminophen  for pain management. - Emphasized consistent daily stretching and icing to prevent recurrence even after symptom improvement.  Procedure: Injection Tendon/Ligament Discussed alternatives, risks, complications and verbal consent was obtained.  Location: Left plantar fascia at the glabrous junction; medial approach. Skin Prep: Alcohol. Injectate: 0.5cc 0.5% marcaine  plain, 0.5 cc 2% lidocaine  plain and, 1 cc kenalog 10. Disposition: Patient tolerated procedure well. Injection site dressed with a band-aid.  Post-injection care was discussed and return precautions discussed.   Return if symptoms worsen or fail to improve.  Charity Conch DPM      Return if symptoms worsen or fail to improve.

## 2023-11-26 NOTE — Patient Instructions (Signed)
